# Patient Record
Sex: Female | Born: 1965 | Race: White | Hispanic: No | Marital: Married | Smoking: Never smoker
Health system: Southern US, Community
[De-identification: ages and names within clinical notes are randomized; demographics above are authoritative.]

## PROBLEM LIST (undated history)

## (undated) HISTORY — PX: CARPAL TUNNEL RELEASE: SHX101

## (undated) HISTORY — PX: TRIGGER FINGER RELEASE: SHX641

---

## 2020-04-30 ENCOUNTER — Ambulatory Visit: Payer: Self-pay | Admitting: Physician Assistant

## 2020-09-05 ENCOUNTER — Other Ambulatory Visit: Payer: Self-pay

## 2020-09-05 ENCOUNTER — Ambulatory Visit (INDEPENDENT_AMBULATORY_CARE_PROVIDER_SITE_OTHER): Payer: BC Managed Care – PPO | Admitting: Physician Assistant

## 2020-09-05 ENCOUNTER — Encounter: Payer: Self-pay | Admitting: Physician Assistant

## 2020-09-05 DIAGNOSIS — L821 Other seborrheic keratosis: Secondary | ICD-10-CM

## 2020-09-05 DIAGNOSIS — R21 Rash and other nonspecific skin eruption: Secondary | ICD-10-CM | POA: Diagnosis not present

## 2020-09-05 DIAGNOSIS — M67431 Ganglion, right wrist: Secondary | ICD-10-CM | POA: Diagnosis not present

## 2020-09-05 DIAGNOSIS — L578 Other skin changes due to chronic exposure to nonionizing radiation: Secondary | ICD-10-CM

## 2020-09-05 DIAGNOSIS — Z1283 Encounter for screening for malignant neoplasm of skin: Secondary | ICD-10-CM

## 2020-09-05 MED ORDER — ALCLOMETASONE DIPROPIONATE 0.05 % EX CREA: TOPICAL_CREAM | Freq: Two times a day (BID) | CUTANEOUS | 5 refills | Status: AC | PRN

## 2020-09-05 NOTE — Progress Notes (Signed)
   New Patient   Subjective  Bonnie Dennis is a 55 y.o. female who presents for the following: Skin Problem (Cyst on right wrist - bothers me when I work, left armpit- x months - "dark" & left hairline- "growing". NO personal history of melanoma or non mole skin cancer.  Patient does have family history of melanoma ).Facial rash 76mo or more. Persistent. No burning, pain or itching. She has never had this before. No change in products.    The following portions of the chart were reviewed this encounter and updated as appropriate:      Objective  Well appearing patient in no apparent distress; mood and affect are within normal limits.  A full examination was performed including scalp, head, eyes, ears, nose, lips, neck, chest, axillae, abdomen, back, buttocks, bilateral upper extremities, bilateral lower extremities, hands, feet, fingers, toes, fingernails, and toenails. All findings within normal limits unless otherwise noted below.  Objective  Head - Anterior (Face): Lower 1/2 of face. Pink with slight scale and erythema.  Images    Objective  Right wrist: Large nodule  Assessment & Plan  Rash Head - Anterior (Face)  If  alclometasone 0.05& Cream fails x 1 month. Add  Minocycline 50mg .  alclomethasone (ACLOVATE) 0.05 % cream - Head - Anterior (Face)  Ganglion cyst of dorsum of right wrist Right wrist  Will call hand surgeon  Lentigines - Scattered tan macules - Discussed due to sun exposure - Benign, observe - Call for any changes  Seborrheic Keratoses - Stuck-on, waxy, tan-brown papules and plaques  - Discussed benign etiology and prognosis. - Observe - Call for any changes  Actinic Damage - diffuse scaly erythematous macules with underlying dyspigmentation - Recommend daily broad spectrum sunscreen SPF 30+ to sun-exposed areas, reapply every 2 hours as needed.  - Call for new or changing lesions.  Skin cancer screening performed today. No atypical moles or non  mole skin cancer.    I, Margeret Stachnik, PA-C, have reviewed all documentation for this visit. The documentation on 09/05/20 for the exam, diagnosis, procedures, and orders are all accurate and complete.

## 2020-12-05 ENCOUNTER — Other Ambulatory Visit: Payer: Self-pay | Admitting: Orthopedic Surgery

## 2020-12-05 DIAGNOSIS — M25831 Other specified joint disorders, right wrist: Secondary | ICD-10-CM

## 2020-12-13 ENCOUNTER — Other Ambulatory Visit: Payer: Self-pay

## 2020-12-13 ENCOUNTER — Ambulatory Visit
Admission: RE | Admit: 2020-12-13 | Discharge: 2020-12-13 | Disposition: A | Discharge status: Home / Self Care | Payer: BC Managed Care – PPO | Source: Ambulatory Visit | Attending: Orthopedic Surgery | Admitting: Orthopedic Surgery

## 2020-12-13 DIAGNOSIS — M25831 Other specified joint disorders, right wrist: Secondary | ICD-10-CM

## 2020-12-25 ENCOUNTER — Other Ambulatory Visit: Payer: Self-pay | Admitting: Orthopedic Surgery

## 2021-01-13 ENCOUNTER — Encounter (HOSPITAL_BASED_OUTPATIENT_CLINIC_OR_DEPARTMENT_OTHER): Payer: Self-pay | Admitting: Orthopedic Surgery

## 2021-01-13 ENCOUNTER — Other Ambulatory Visit: Payer: Self-pay

## 2021-01-21 ENCOUNTER — Ambulatory Visit (HOSPITAL_BASED_OUTPATIENT_CLINIC_OR_DEPARTMENT_OTHER)
Admission: RE | Admit: 2021-01-21 | Discharge: 2021-01-21 | Disposition: A | Discharge status: Home / Self Care | Payer: BC Managed Care – PPO | Attending: Orthopedic Surgery | Admitting: Orthopedic Surgery

## 2021-01-21 ENCOUNTER — Other Ambulatory Visit: Payer: Self-pay

## 2021-01-21 ENCOUNTER — Encounter (HOSPITAL_BASED_OUTPATIENT_CLINIC_OR_DEPARTMENT_OTHER)
Admission: RE | Disposition: A | Discharge status: Home / Self Care | Payer: Self-pay | Source: Home / Self Care | Attending: Orthopedic Surgery

## 2021-01-21 ENCOUNTER — Ambulatory Visit (HOSPITAL_BASED_OUTPATIENT_CLINIC_OR_DEPARTMENT_OTHER): Payer: BC Managed Care – PPO | Admitting: Certified Registered"

## 2021-01-21 ENCOUNTER — Encounter (HOSPITAL_BASED_OUTPATIENT_CLINIC_OR_DEPARTMENT_OTHER): Payer: Self-pay | Admitting: Orthopedic Surgery

## 2021-01-21 DIAGNOSIS — M67431 Ganglion, right wrist: Secondary | ICD-10-CM | POA: Diagnosis not present

## 2021-01-21 HISTORY — PX: GANGLION CYST EXCISION: SHX1691

## 2021-01-21 LAB — POCT PREGNANCY, URINE: Preg Test, Ur: NEGATIVE

## 2021-01-21 SURGERY — EXCISION, GANGLION CYST, WRIST
Anesthesia: General | Site: Wrist | Laterality: Right

## 2021-01-21 MED ORDER — PROPOFOL 10 MG/ML IV BOLUS
INTRAVENOUS | Status: AC
  Filled 2021-01-21: qty 20

## 2021-01-21 MED ORDER — CEFAZOLIN SODIUM-DEXTROSE 2-4 GM/100ML-% IV SOLN
INTRAVENOUS | Status: AC
  Filled 2021-01-21: qty 100

## 2021-01-21 MED ORDER — DEXAMETHASONE SODIUM PHOSPHATE 10 MG/ML IJ SOLN
INTRAMUSCULAR | Status: AC
  Filled 2021-01-21: qty 1

## 2021-01-21 MED ORDER — PROPOFOL 10 MG/ML IV BOLUS
INTRAVENOUS | Status: DC | PRN
  Administered 2021-01-21: 200 mg via INTRAVENOUS

## 2021-01-21 MED ORDER — FENTANYL CITRATE (PF) 100 MCG/2ML IJ SOLN
INTRAMUSCULAR | Status: DC | PRN
  Administered 2021-01-21 (×2): 25 ug via INTRAVENOUS
  Administered 2021-01-21: 50 ug via INTRAVENOUS

## 2021-01-21 MED ORDER — OXYCODONE HCL 5 MG PO TABS: 5.0000 mg | ORAL_TABLET | Freq: Once | ORAL | Status: DC | PRN

## 2021-01-21 MED ORDER — MEPERIDINE HCL 25 MG/ML IJ SOLN: 6.2500 mg | INTRAMUSCULAR | Status: DC | PRN

## 2021-01-21 MED ORDER — MIDAZOLAM HCL 2 MG/2ML IJ SOLN
INTRAMUSCULAR | Status: AC
  Filled 2021-01-21: qty 2

## 2021-01-21 MED ORDER — LACTATED RINGERS IV SOLN: INTRAVENOUS | Status: DC

## 2021-01-21 MED ORDER — MIDAZOLAM HCL 5 MG/5ML IJ SOLN
INTRAMUSCULAR | Status: DC | PRN
  Administered 2021-01-21: 2 mg via INTRAVENOUS

## 2021-01-21 MED ORDER — 0.9 % SODIUM CHLORIDE (POUR BTL) OPTIME
TOPICAL | Status: DC | PRN
  Administered 2021-01-21: 50 mL

## 2021-01-21 MED ORDER — CEFAZOLIN SODIUM-DEXTROSE 2-4 GM/100ML-% IV SOLN
2.0000 g | INTRAVENOUS | Status: AC
  Administered 2021-01-21: 2 g via INTRAVENOUS

## 2021-01-21 MED ORDER — DEXAMETHASONE SODIUM PHOSPHATE 4 MG/ML IJ SOLN
INTRAMUSCULAR | Status: DC | PRN
  Administered 2021-01-21: 6 mg via INTRAVENOUS

## 2021-01-21 MED ORDER — BUPIVACAINE HCL (PF) 0.25 % IJ SOLN
INTRAMUSCULAR | Status: DC | PRN
  Administered 2021-01-21: 5 mL

## 2021-01-21 MED ORDER — HYDROMORPHONE HCL 1 MG/ML IJ SOLN: 0.2500 mg | INTRAMUSCULAR | Status: DC | PRN

## 2021-01-21 MED ORDER — KETOROLAC TROMETHAMINE 30 MG/ML IJ SOLN
INTRAMUSCULAR | Status: AC
  Filled 2021-01-21: qty 1

## 2021-01-21 MED ORDER — TRAMADOL HCL 50 MG PO TABS: 50.0000 mg | ORAL_TABLET | Freq: Four times a day (QID) | ORAL | 0 refills | Status: AC | PRN

## 2021-01-21 MED ORDER — ONDANSETRON HCL 4 MG/2ML IJ SOLN
INTRAMUSCULAR | Status: AC
  Filled 2021-01-21: qty 2

## 2021-01-21 MED ORDER — ONDANSETRON HCL 4 MG/2ML IJ SOLN
INTRAMUSCULAR | Status: DC | PRN
  Administered 2021-01-21: 4 mg via INTRAVENOUS

## 2021-01-21 MED ORDER — LIDOCAINE 2% (20 MG/ML) 5 ML SYRINGE
INTRAMUSCULAR | Status: AC
  Filled 2021-01-21: qty 5

## 2021-01-21 MED ORDER — AMISULPRIDE (ANTIEMETIC) 5 MG/2ML IV SOLN: 10.0000 mg | Freq: Once | INTRAVENOUS | Status: DC | PRN

## 2021-01-21 MED ORDER — OXYCODONE HCL 5 MG/5ML PO SOLN: 5.0000 mg | Freq: Once | ORAL | Status: DC | PRN

## 2021-01-21 MED ORDER — PROMETHAZINE HCL 25 MG/ML IJ SOLN: 6.2500 mg | INTRAMUSCULAR | Status: DC | PRN

## 2021-01-21 MED ORDER — FENTANYL CITRATE (PF) 100 MCG/2ML IJ SOLN
INTRAMUSCULAR | Status: AC
  Filled 2021-01-21: qty 2

## 2021-01-21 SURGICAL SUPPLY — 42 items
APL PRP STRL LF DISP 70% ISPRP (MISCELLANEOUS) ×1
BLADE MINI RND TIP GREEN BEAV (BLADE) IMPLANT
BLADE SURG 15 STRL LF DISP TIS (BLADE) ×1 IMPLANT
BLADE SURG 15 STRL SS (BLADE) ×2
BNDG CMPR 9X4 STRL LF SNTH (GAUZE/BANDAGES/DRESSINGS) ×1
BNDG COHESIVE 3X5 TAN ST LF (GAUZE/BANDAGES/DRESSINGS) ×2 IMPLANT
BNDG ESMARK 4X9 LF (GAUZE/BANDAGES/DRESSINGS) ×2 IMPLANT
BNDG GAUZE ELAST 4 BULKY (GAUZE/BANDAGES/DRESSINGS) ×2 IMPLANT
CHLORAPREP W/TINT 26 (MISCELLANEOUS) ×2 IMPLANT
CORD BIPOLAR FORCEPS 12FT (ELECTRODE) ×2 IMPLANT
COVER BACK TABLE 60X90IN (DRAPES) ×2 IMPLANT
COVER MAYO STAND STRL (DRAPES) ×2 IMPLANT
CUFF TOURN SGL QUICK 18X4 (TOURNIQUET CUFF) ×2 IMPLANT
DECANTER SPIKE VIAL GLASS SM (MISCELLANEOUS) IMPLANT
DRAPE EXTREMITY T 121X128X90 (DISPOSABLE) ×2 IMPLANT
DRAPE SURG 17X23 STRL (DRAPES) ×2 IMPLANT
GAUZE SPONGE 4X4 12PLY STRL (GAUZE/BANDAGES/DRESSINGS) ×2 IMPLANT
GAUZE XEROFORM 1X8 LF (GAUZE/BANDAGES/DRESSINGS) ×2 IMPLANT
GLOVE SURG ORTHO LTX SZ8 (GLOVE) ×2 IMPLANT
GLOVE SURG POLYISO LF SZ6.5 (GLOVE) ×2 IMPLANT
GLOVE SURG UNDER POLY LF SZ7 (GLOVE) ×4 IMPLANT
GLOVE SURG UNDER POLY LF SZ8.5 (GLOVE) ×2 IMPLANT
GOWN STRL REUS W/ TWL LRG LVL3 (GOWN DISPOSABLE) ×1 IMPLANT
GOWN STRL REUS W/TWL LRG LVL3 (GOWN DISPOSABLE) ×2
GOWN STRL REUS W/TWL XL LVL3 (GOWN DISPOSABLE) ×2 IMPLANT
NEEDLE PRECISIONGLIDE 27X1.5 (NEEDLE) ×2 IMPLANT
NS IRRIG 1000ML POUR BTL (IV SOLUTION) ×2 IMPLANT
PACK BASIN DAY SURGERY FS (CUSTOM PROCEDURE TRAY) ×2 IMPLANT
PAD CAST 3X4 CTTN HI CHSV (CAST SUPPLIES) ×1 IMPLANT
PADDING CAST ABS 4INX4YD NS (CAST SUPPLIES) ×1
PADDING CAST ABS COTTON 4X4 ST (CAST SUPPLIES) ×1 IMPLANT
PADDING CAST COTTON 3X4 STRL (CAST SUPPLIES) ×2
SPLINT PLASTER CAST XFAST 3X15 (CAST SUPPLIES) ×5 IMPLANT
SPLINT PLASTER XTRA FASTSET 3X (CAST SUPPLIES) ×5
STOCKINETTE 4X48 STRL (DRAPES) ×2 IMPLANT
SUT ETHILON 4 0 PS 2 18 (SUTURE) ×2 IMPLANT
SUT VIC AB 4-0 P2 18 (SUTURE) ×2 IMPLANT
SUT VICRYL 4-0 PS2 18IN ABS (SUTURE) IMPLANT
SYR BULB EAR ULCER 3OZ GRN STR (SYRINGE) ×2 IMPLANT
SYR CONTROL 10ML LL (SYRINGE) ×2 IMPLANT
TOWEL GREEN STERILE FF (TOWEL DISPOSABLE) ×4 IMPLANT
UNDERPAD 30X36 HEAVY ABSORB (UNDERPADS AND DIAPERS) ×2 IMPLANT

## 2021-01-21 NOTE — Brief Op Note (Signed)
01/21/2021  11:01 AM  PATIENT:  Bonnie Dennis  55 y.o. female  PRE-OPERATIVE DIAGNOSIS:  GANGLION RIGHT WRIST VOLAR  POST-OPERATIVE DIAGNOSIS:  GANGLION RIGHT WRIST VOLAR  PROCEDURE:  Procedure(s): EXCISION VOLAR GANGLION RIGHT WRIST (Right)  SURGEON:  Surgeon(s) and Role:    * Cindee Salt, MD - Primary  PHYSICIAN ASSISTANT:   ASSISTANTS: none   ANESTHESIA:   local and general  EBL:  1 mL   BLOOD ADMINISTERED:none  DRAINS: none   LOCAL MEDICATIONS USED:  BUPIVICAINE   SPECIMEN:  Excision  DISPOSITION OF SPECIMEN:  PATHOLOGY  COUNTS:  YES  TOURNIQUET:   Total Tourniquet Time Documented: Upper Arm (Right) - 21 minutes Total: Upper Arm (Right) - 21 minutes   DICTATION: .Reubin Milan Dictation  PLAN OF CARE: Discharge to home after PACU  PATIENT DISPOSITION:  PACU - hemodynamically stable.

## 2021-01-21 NOTE — Discharge Instructions (Addendum)

## 2021-01-21 NOTE — H&P (Signed)
  Bonnie Dennis is an 55 y.o. female.   Chief Complaint: mass right wrist HPI: Bonnie Dennis is a 55 year old right-hand-dominant female who comes in complaining of a mass on the volar radial aspect of her right wrist. She states is been present approximately 2 years. She states it was drained approximately a year ago in [place] but has come back recently saw dermatologist who recommended that she have it removed. She has had bilateral carpal tunnel release she has no history of diabetes thyroid problems arthritis or gout. She states that before it was aspirated she had pain. She states she is having no pain with this. She has not taken anything for it. She states nothing seems to make it better or worse. She was sent for an ultrasound to solid he was concerned about whether she would want to have this out or not. The ultrasound has been done and read by Dr. Lennette Bihari revealing a 1.4 x 0.8 x 1 cm ganglion cyst on the volar radial wrist. There is no surrounding or internal vascularity.  History reviewed. No pertinent past medical history.  Past Surgical History:  Procedure Laterality Date   CARPAL TUNNEL RELEASE Bilateral    2011   CESAREAN SECTION     TRIGGER FINGER RELEASE     2011    Family History  Problem Relation Age of Onset   Melanoma Mother    Social History:  reports that she has never smoked. She has never used smokeless tobacco. She reports current alcohol use. She reports that she does not use drugs.  Allergies: No Known Allergies  No medications prior to admission.    No results found for this or any previous visit (from the past 48 hour(s)).  No results found.   Pertinent items are noted in HPI.  Height 5\' 2"  (1.575 m), weight 68 kg, last menstrual period 12/22/2020.  General appearance: alert, cooperative, and appears stated age Head: Normocephalic, without obvious abnormality, atraumatic Neck: no JVD Resp: clear to auscultation bilaterally Cardio: regular rate and rhythm, S1,  S2 normal, no murmur, click, rub or gallop GI: soft, non-tender; bowel sounds normal; no masses,  no organomegaly Extremities: mass right wrist Pulses: 2+ and symmetric Skin: Skin color, texture, turgor normal. No rashes or lesions Neurologic: Grossly normal Incision/Wound: na  Assessment/Plan Assessment:  1. Ganglion of right wrist    Plan: We have discussed surgical excision of the cyst with her she would like to proceed to have this removed. Preperi-and postoperative course are discussed along with risk and complications. She is aware there is no guarantee to the surgery the possibility of infection recurrence injury to arteries nerves tendons incomplete relief symptoms dystrophy. She is advised of recurrence of 20% volar radial wrist ganglions. She would like to proceed and schedule for excision and volar volar right radial wrist ganglion as an outpatient under regional anesthesia  02/21/2021 01/21/2021, 8:35 AM

## 2021-01-21 NOTE — Transfer of Care (Signed)
Immediate Anesthesia Transfer of Care Note  Patient: Bonnie Dennis  Procedure(s) Performed: EXCISION VOLAR GANGLION RIGHT WRIST (Right: Wrist)  Patient Location: PACU  Anesthesia Type:General  Level of Consciousness: drowsy  Airway & Oxygen Therapy: Patient Spontanous Breathing and Patient connected to face mask oxygen  Post-op Assessment: Report given to RN and Post -op Vital signs reviewed and stable  Post vital signs: Reviewed and stable  Last Vitals:  Vitals Value Taken Time  BP    Temp    Pulse 74 01/21/21 1103  Resp    SpO2 98 % 01/21/21 1103    Last Pain:  Vitals:   01/21/21 0949  TempSrc: Oral  PainSc: 0-No pain         Complications: No notable events documented.

## 2021-01-21 NOTE — Anesthesia Postprocedure Evaluation (Signed)
Anesthesia Post Note  Patient: Bonnie Dennis  Procedure(s) Performed: EXCISION VOLAR GANGLION RIGHT WRIST (Right: Wrist)     Patient location during evaluation: PACU Anesthesia Type: General Level of consciousness: awake and alert Pain management: pain level controlled Vital Signs Assessment: post-procedure vital signs reviewed and stable Respiratory status: spontaneous breathing, nonlabored ventilation and respiratory function stable Cardiovascular status: blood pressure returned to baseline and stable Postop Assessment: no apparent nausea or vomiting Anesthetic complications: no   No notable events documented.  Last Vitals:  Vitals:   01/21/21 1115 01/21/21 1130  BP: 117/71 119/88  Pulse: 80 96  Resp: 12 16  Temp:  36.6 C  SpO2: 98% 97%    Last Pain:  Vitals:   01/21/21 1145  TempSrc:   PainSc: 0-No pain                 Lowella Curb

## 2021-01-21 NOTE — Anesthesia Procedure Notes (Signed)
Procedure Name: LMA Insertion Date/Time: 01/21/2021 10:25 AM Performed by: Alford Highland, CRNA Pre-anesthesia Checklist: Patient identified, Emergency Drugs available, Suction available and Patient being monitored Patient Re-evaluated:Patient Re-evaluated prior to induction Oxygen Delivery Method: Circle System Utilized Preoxygenation: Pre-oxygenation with 100% oxygen Induction Type: IV induction Ventilation: Mask ventilation without difficulty LMA: LMA inserted LMA Size: 3.0 Number of attempts: 1 Airway Equipment and Method: Bite block Placement Confirmation: positive ETCO2 Tube secured with: Tape Dental Injury: Teeth and Oropharynx as per pre-operative assessment

## 2021-01-21 NOTE — Anesthesia Preprocedure Evaluation (Signed)
Anesthesia Evaluation  ?Patient identified by MRN, date of birth, ID band ?Patient awake ? ? ? ?Reviewed: ?Allergy & Precautions, NPO status , Patient's Chart, lab work & pertinent test results ? ?Airway ?Mallampati: II ? ?TM Distance: >3 FB ?Neck ROM: Full ? ? ? Dental ?no notable dental hx. ? ?  ?Pulmonary ?neg pulmonary ROS,  ?  ?Pulmonary exam normal ?breath sounds clear to auscultation ? ? ? ? ? ? Cardiovascular ?negative cardio ROS ?Normal cardiovascular exam ?Rhythm:Regular Rate:Normal ? ? ?  ?Neuro/Psych ?negative neurological ROS ? negative psych ROS  ? GI/Hepatic ?negative GI ROS, Neg liver ROS,   ?Endo/Other  ?negative endocrine ROS ? Renal/GU ?negative Renal ROS  ?negative genitourinary ?  ?Musculoskeletal ?negative musculoskeletal ROS ?(+)  ? Abdominal ?  ?Peds ?negative pediatric ROS ?(+)  Hematology ?negative hematology ROS ?(+)   ?Anesthesia Other Findings ? ? Reproductive/Obstetrics ?negative OB ROS ? ?  ? ? ? ? ? ? ? ? ? ? ? ? ? ?  ?  ? ? ? ? ? ? ? ? ?Anesthesia Physical ?Anesthesia Plan ? ?ASA: 2 ? ?Anesthesia Plan: General  ? ?Post-op Pain Management:   ? ?Induction: Intravenous ? ?PONV Risk Score and Plan: 3 and Ondansetron, Dexamethasone, Midazolam and Treatment may vary due to age or medical condition ? ?Airway Management Planned: LMA ? ?Additional Equipment:  ? ?Intra-op Plan:  ? ?Post-operative Plan: Extubation in OR ? ?Informed Consent: I have reviewed the patients History and Physical, chart, labs and discussed the procedure including the risks, benefits and alternatives for the proposed anesthesia with the patient or authorized representative who has indicated his/her understanding and acceptance.  ? ? ? ?Dental advisory given ? ?Plan Discussed with: CRNA ? ?Anesthesia Plan Comments:   ? ? ? ? ? ? ?Anesthesia Quick Evaluation ? ?

## 2021-01-21 NOTE — Op Note (Signed)
NAME: Bonnie Dennis MEDICAL RECORD NO: [id number] DATE OF BIRTH: 1966/03/31 FACILITY: [facility] LOCATION: [facility] PHYSICIAN: Nicki Reaper, MD   OPERATIVE REPORT   DATE OF PROCEDURE: 01/21/21    PREOPERATIVE DIAGNOSIS: Volar wrist ganglion right   POSTOPERATIVE DIAGNOSIS: Same   PROCEDURE: Excision volar wrist ganglion right   SURGEON: Cindee Salt, M.D.   ASSISTANT: none   ANESTHESIA:  General and Local   INTRAVENOUS FLUIDS:  Per anesthesia flow sheet.   ESTIMATED BLOOD LOSS:  Minimal.   COMPLICATIONS:  None.   SPECIMENS: Cyst   TOURNIQUET TIME:    Total Tourniquet Time Documented: Upper Arm (Right) - 21 minutes Total: Upper Arm (Right) - 21 minutes    DISPOSITION:  Stable to PACU.   INDICATIONS: Patient is a 55 year old female with a history of a mass volar radial aspect of the wrist.  This has been problematic for her.  She has had an ultrasound done revealing a cystic lesion with little to no vascularity surrounding it.  She is desires having it excised.  Pre-.  Postoperative course been discussed along with risks and complications.  She is aware that there is no guarantee to the surgery the possibility of infection recurrence injury to arteries nerves tendons incomplete relief symptoms dystrophy a 20% recurrence rate with volar radial wrist ganglion is in the preoperative area the patient is seen extremity marked by both patient and surgeon antibiotic given  OPERATIVE COURSE: Patient is brought to the operating room placed in supine position with the right arm free.  General anesthetic was carried out without difficulty under the direction of the anesthesia department.  Prep was done with ChloraPrep and a 3-minute dry time allowed.  Timeout was taken confirming patient procedure.  The limb was exsanguinated with an Esmarch bandage turn placed high in the arm was inflated to 250 mmHg.  A longitudinal incision was made directly over the mass carried down  through subcutaneous tissue.  Bleeders were electrocauterized with bipolar as necessary.  She had a very rudimentary radial artery.  Vena comitans and artery were protected the cyst was identified and with blunt dissection was isolated this was followed down into the radiocarpal joint.  This was excised and sent to pathology.  The wound was copious irrigated with saline.  The area of 8 egressed was debrided with a rondure and then repaired with interrupted 4-0 Vicryl sutures.  The subcutaneous tissue was closed with interrupted 4-0 Vicryl and the skin with interrupted 4-0 nylon sutures.  A sterile compressive dressing volar splint was applied.  On deflation of the tourniquet all fingers immediately pink.  She was taken to the recovery room for observation and satisfactory condition.  She will be discharged home to return to the hand center [facility] in 1 week on Tylenol ibuprofen for pain with Ultram for breakthrough.   Cindee Salt, MD Electronically signed, 01/21/21

## 2021-01-22 LAB — SURGICAL PATHOLOGY

## 2021-01-24 ENCOUNTER — Encounter (HOSPITAL_BASED_OUTPATIENT_CLINIC_OR_DEPARTMENT_OTHER): Payer: Self-pay | Admitting: Orthopedic Surgery

## 2022-05-19 IMAGING — US US EXTREM UP *R* LTD
2 series · 14 of 19 positions shown · non-contrast
Comparison: None

CLINICAL DATA: Mass of joint of right wrist

EXAM:
ULTRASOUND right wrist UPPER EXTREMITY LIMITED
TECHNIQUE: Ultrasound examination of the upper extremity soft tissues was
performed in the area of clinical concern.

[Series 1: us extrem up *right* ltd · 13 acquisitions, 10 frames shown (1 of 2)]
[im 1/13]
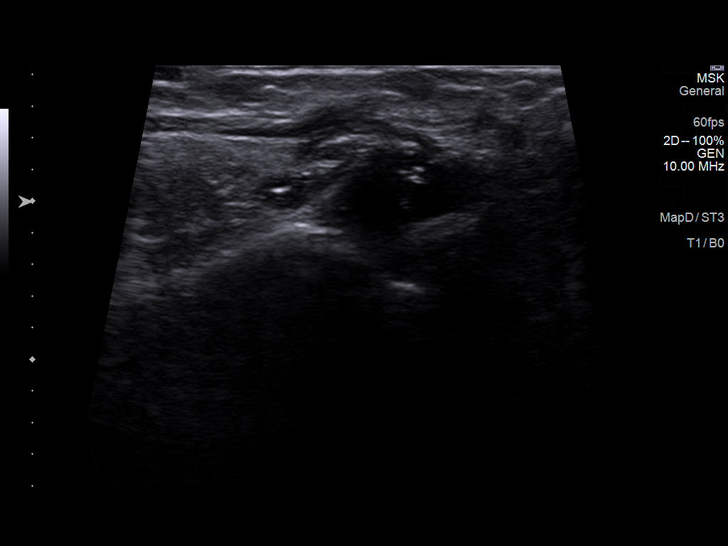
[im 3/13]
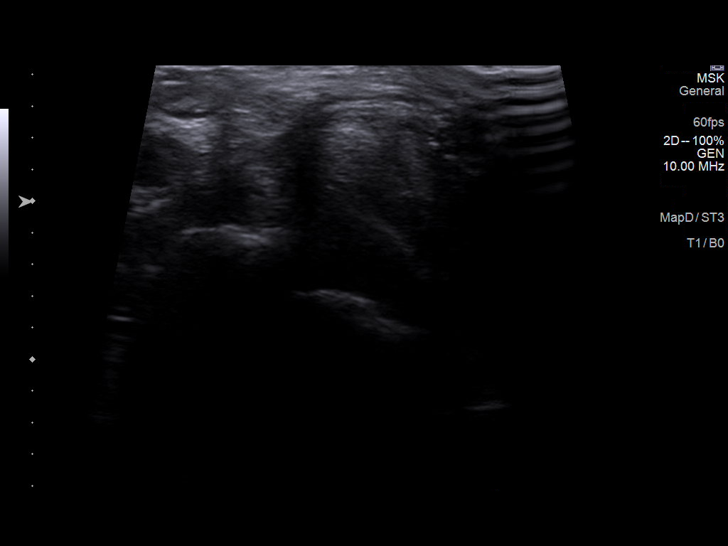
[im 4/13]
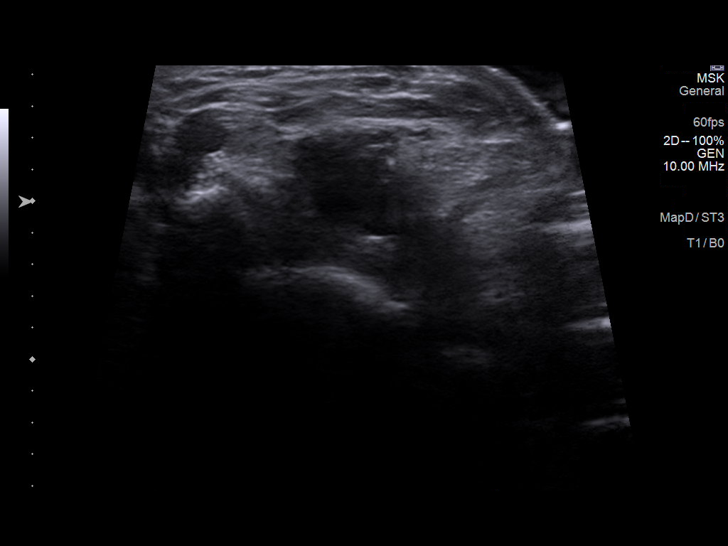
[im 5/13]
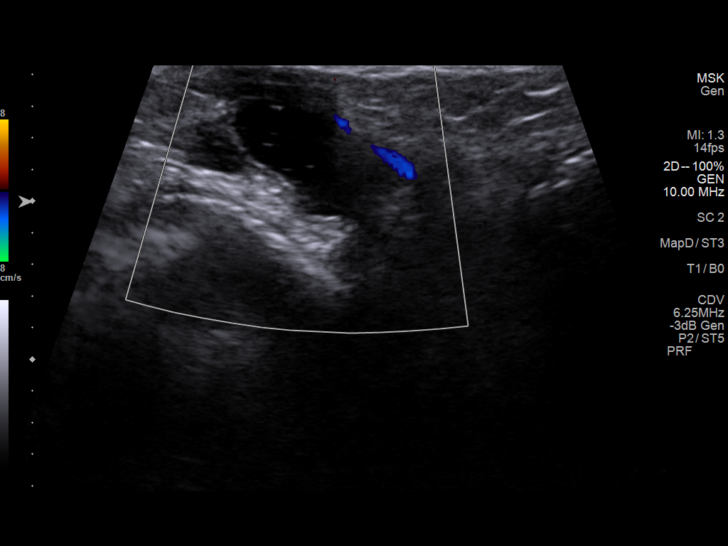
[im 7/13]
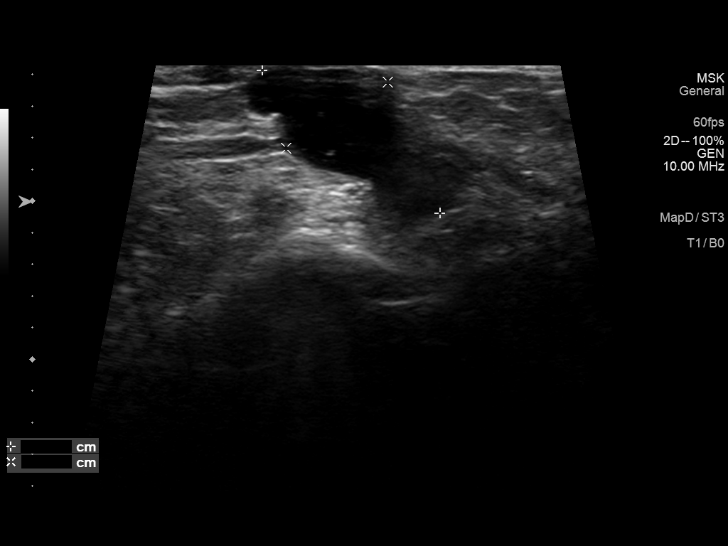
[im 8/13]
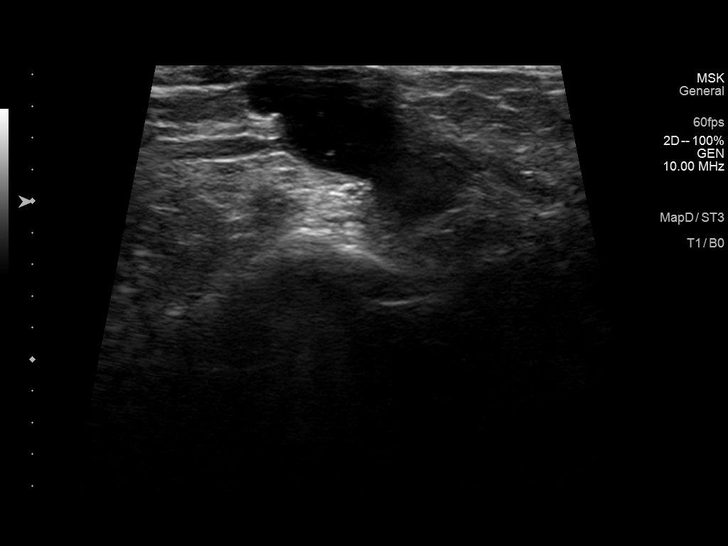
[im 9/13]
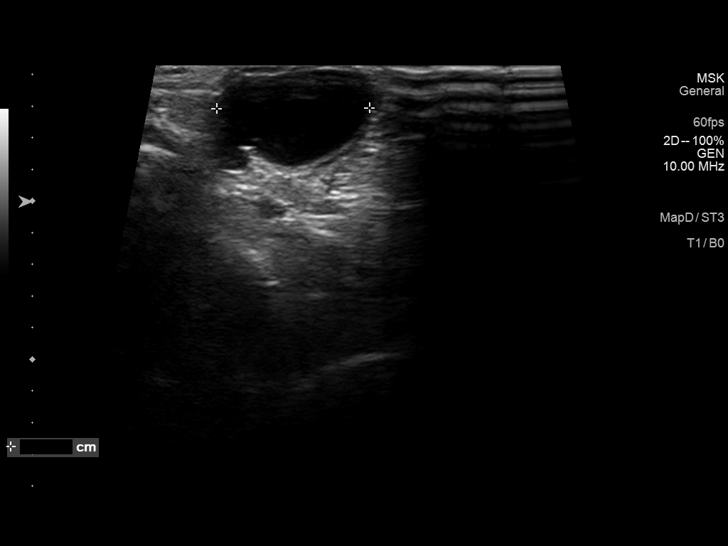
[im 11/13]
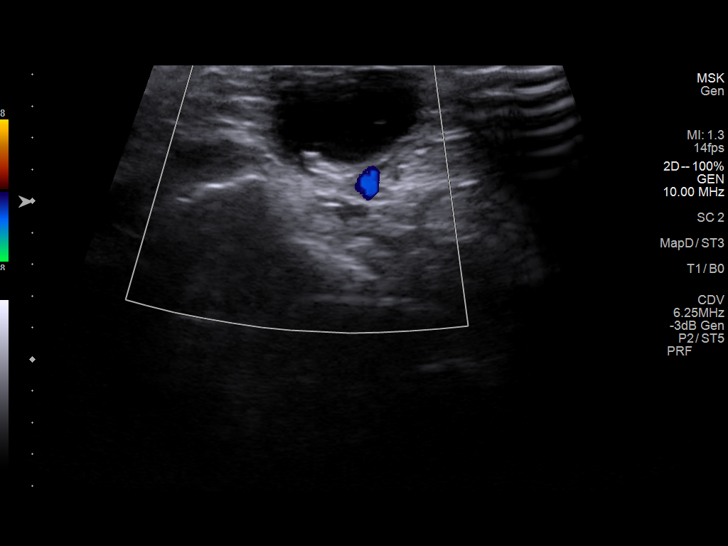
[im 12/13]
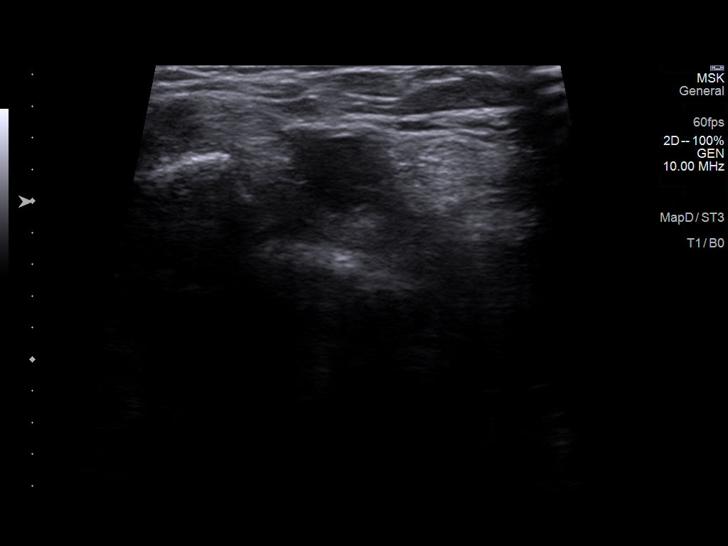
[im 13/13]
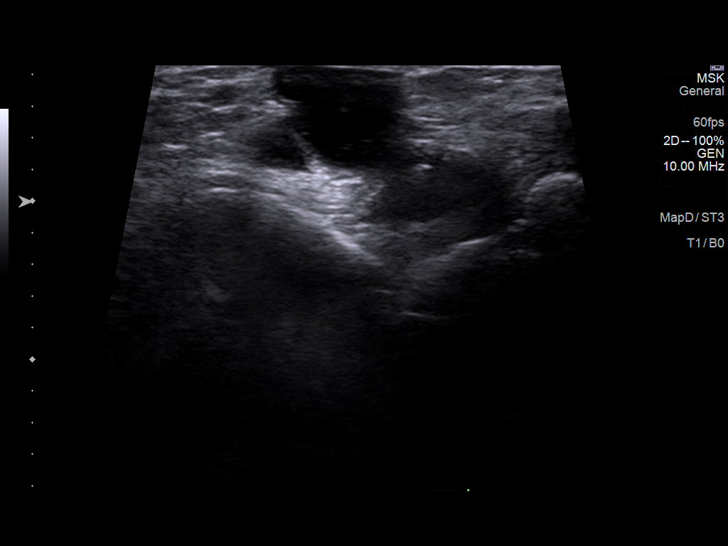

[Series 2: us extrem up *right* ltd · 0.04mm/px · 4 of 6 slices shown (2 of 2)]
[im 2/6]
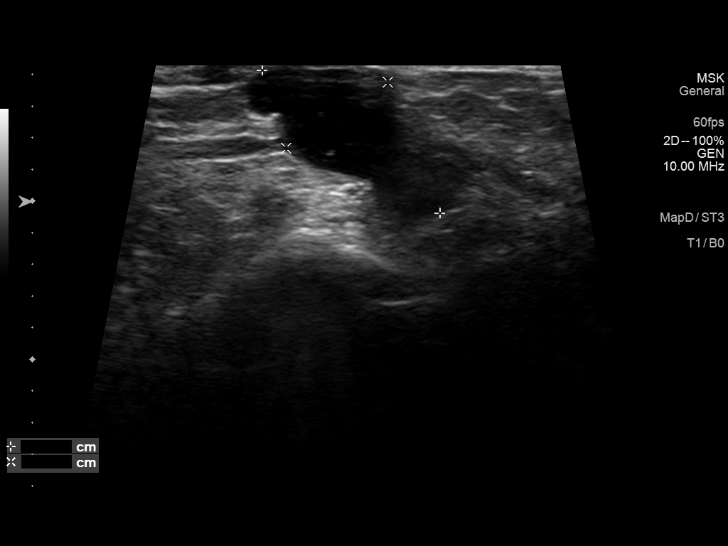
[im 3/6]
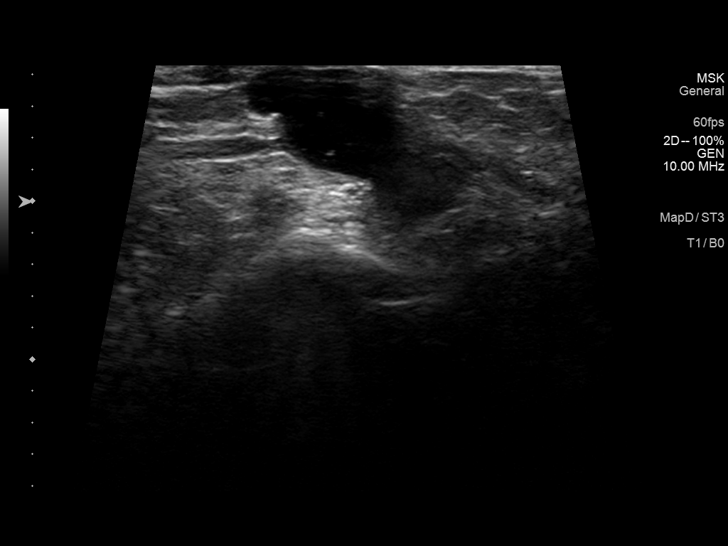
[im 4/6]
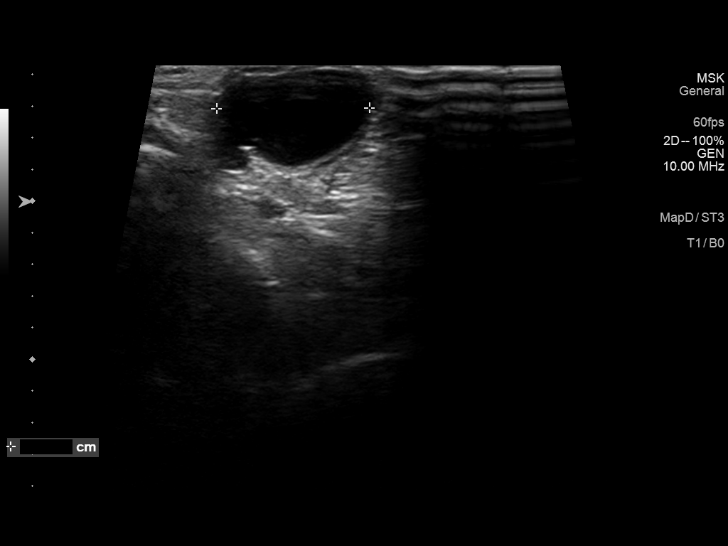
[im 6/6]
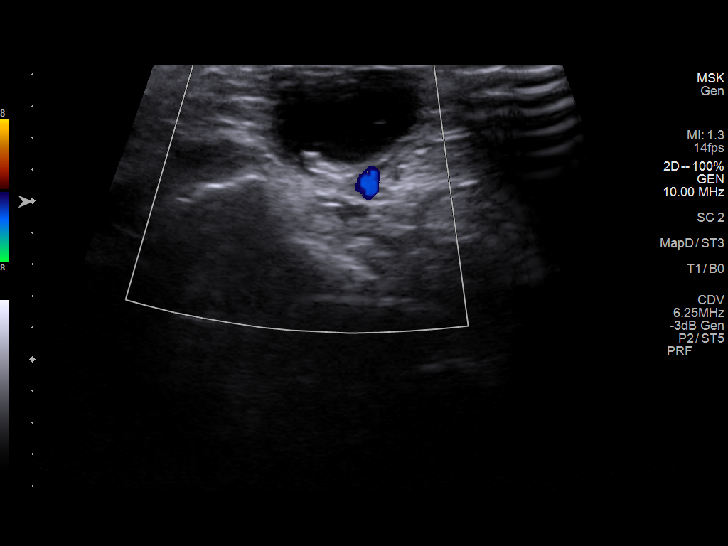

[14 of 19 positions shown; findings below may reference images not displayed]

FINDINGS: Within the area of concern along the volar right wrist, there is a
predominantly anechoic cystic lesion measuring 1.4 x 0.8 x 1.0 cm.
There is some mild internal debris. There is no internal or
surrounding vascularity.
IMPRESSION: 1.4 x 0.8 x 1.0 cm ganglion cyst along the volar wrist.
